# Patient Record
Sex: Female | Born: 1935 | Race: White | Hispanic: No | Smoking: Never smoker
Health system: Southern US, Community
[De-identification: ages and names within clinical notes are randomized; demographics above are authoritative.]

## PROBLEM LIST (undated history)

## (undated) DIAGNOSIS — K5790 Diverticulosis of intestine, part unspecified, without perforation or abscess without bleeding: Secondary | ICD-10-CM

## (undated) DIAGNOSIS — E785 Hyperlipidemia, unspecified: Secondary | ICD-10-CM

## (undated) DIAGNOSIS — K922 Gastrointestinal hemorrhage, unspecified: Secondary | ICD-10-CM

## (undated) DIAGNOSIS — M1990 Unspecified osteoarthritis, unspecified site: Secondary | ICD-10-CM

## (undated) DIAGNOSIS — E860 Dehydration: Secondary | ICD-10-CM

## (undated) DIAGNOSIS — R1310 Dysphagia, unspecified: Secondary | ICD-10-CM

## (undated) DIAGNOSIS — E0590 Thyrotoxicosis, unspecified without thyrotoxic crisis or storm: Secondary | ICD-10-CM

## (undated) DIAGNOSIS — I2510 Atherosclerotic heart disease of native coronary artery without angina pectoris: Secondary | ICD-10-CM

## (undated) DIAGNOSIS — I4891 Unspecified atrial fibrillation: Secondary | ICD-10-CM

## (undated) DIAGNOSIS — G8191 Hemiplegia, unspecified affecting right dominant side: Secondary | ICD-10-CM

## (undated) DIAGNOSIS — K5900 Constipation, unspecified: Secondary | ICD-10-CM

## (undated) DIAGNOSIS — I739 Peripheral vascular disease, unspecified: Secondary | ICD-10-CM

## (undated) DIAGNOSIS — F0390 Unspecified dementia without behavioral disturbance: Secondary | ICD-10-CM

---

## 2015-01-03 ENCOUNTER — Emergency Department (HOSPITAL_COMMUNITY)
Admission: EM | Admit: 2015-01-03 | Discharge: 2015-01-04 | Disposition: A | Discharge status: Home / Self Care | Payer: Medicare Other | Attending: Emergency Medicine | Admitting: Emergency Medicine

## 2015-01-03 ENCOUNTER — Emergency Department (HOSPITAL_COMMUNITY): Payer: Medicare Other

## 2015-01-03 ENCOUNTER — Encounter (HOSPITAL_COMMUNITY): Payer: Self-pay | Admitting: *Deleted

## 2015-01-03 DIAGNOSIS — F0390 Unspecified dementia without behavioral disturbance: Secondary | ICD-10-CM | POA: Diagnosis not present

## 2015-01-03 DIAGNOSIS — M1990 Unspecified osteoarthritis, unspecified site: Secondary | ICD-10-CM | POA: Diagnosis not present

## 2015-01-03 DIAGNOSIS — Z79899 Other long term (current) drug therapy: Secondary | ICD-10-CM | POA: Insufficient documentation

## 2015-01-03 DIAGNOSIS — W1839XA Other fall on same level, initial encounter: Secondary | ICD-10-CM | POA: Diagnosis not present

## 2015-01-03 DIAGNOSIS — Y998 Other external cause status: Secondary | ICD-10-CM | POA: Diagnosis not present

## 2015-01-03 DIAGNOSIS — S0083XA Contusion of other part of head, initial encounter: Secondary | ICD-10-CM | POA: Insufficient documentation

## 2015-01-03 DIAGNOSIS — Z8639 Personal history of other endocrine, nutritional and metabolic disease: Secondary | ICD-10-CM | POA: Diagnosis not present

## 2015-01-03 DIAGNOSIS — I4891 Unspecified atrial fibrillation: Secondary | ICD-10-CM | POA: Diagnosis not present

## 2015-01-03 DIAGNOSIS — K5900 Constipation, unspecified: Secondary | ICD-10-CM | POA: Diagnosis not present

## 2015-01-03 DIAGNOSIS — Z8669 Personal history of other diseases of the nervous system and sense organs: Secondary | ICD-10-CM | POA: Diagnosis not present

## 2015-01-03 DIAGNOSIS — Y92128 Other place in nursing home as the place of occurrence of the external cause: Secondary | ICD-10-CM | POA: Diagnosis not present

## 2015-01-03 DIAGNOSIS — S0181XA Laceration without foreign body of other part of head, initial encounter: Secondary | ICD-10-CM | POA: Diagnosis present

## 2015-01-03 DIAGNOSIS — T148XXA Other injury of unspecified body region, initial encounter: Secondary | ICD-10-CM

## 2015-01-03 DIAGNOSIS — Z7982 Long term (current) use of aspirin: Secondary | ICD-10-CM | POA: Diagnosis not present

## 2015-01-03 DIAGNOSIS — Y9389 Activity, other specified: Secondary | ICD-10-CM | POA: Diagnosis not present

## 2015-01-03 DIAGNOSIS — W19XXXA Unspecified fall, initial encounter: Secondary | ICD-10-CM

## 2015-01-03 HISTORY — DX: Unspecified atrial fibrillation: I48.91

## 2015-01-03 HISTORY — DX: Hemiplegia, unspecified affecting right dominant side: G81.91

## 2015-01-03 HISTORY — DX: Gastrointestinal hemorrhage, unspecified: K92.2

## 2015-01-03 HISTORY — DX: Unspecified dementia, unspecified severity, without behavioral disturbance, psychotic disturbance, mood disturbance, and anxiety: F03.90

## 2015-01-03 HISTORY — DX: Thyrotoxicosis, unspecified without thyrotoxic crisis or storm: E05.90

## 2015-01-03 HISTORY — DX: Peripheral vascular disease, unspecified: I73.9

## 2015-01-03 HISTORY — DX: Hyperlipidemia, unspecified: E78.5

## 2015-01-03 HISTORY — DX: Dysphagia, unspecified: R13.10

## 2015-01-03 HISTORY — DX: Diverticulosis of intestine, part unspecified, without perforation or abscess without bleeding: K57.90

## 2015-01-03 HISTORY — DX: Constipation, unspecified: K59.00

## 2015-01-03 HISTORY — DX: Unspecified osteoarthritis, unspecified site: M19.90

## 2015-01-03 HISTORY — DX: Dehydration: E86.0

## 2015-01-03 HISTORY — DX: Atherosclerotic heart disease of native coronary artery without angina pectoris: I25.10

## 2015-01-03 NOTE — Discharge Instructions (Signed)

## 2015-01-03 NOTE — ED Provider Notes (Signed)
CSN: [id number]     Arrival date & time 01/03/15  1948 History   First MD Initiated Contact with Patient 01/03/15 2000     Chief Complaint  Patient presents with  . Fall     (Consider location/radiation/quality/duration/timing/severity/associated sxs/prior Treatment) HPI Comments: Patient presents to the emergency department after a fall. Patient was found on the floor, circumstances unknown. Patient does have a history of dementia, cannot provide further information. EMS report hematoma and laceration of forehead, has been bandaged. Level V Caveat due to dementia.  Patient is a 81 y.o. female presenting with fall.  Fall    Past Medical History  Diagnosis Date  . Thyrotoxicosis   . Hyperlipidemia   . Dehydration   . Dementia   . Hemiplegia affecting right dominant side   . Atrial fibrillation   . CVD (cardiovascular disease)   . PVD (peripheral vascular disease)   . Diverticulosis   . Constipation   . GI hemorrhage   . Osteoarthritis   . Dysphagia    History reviewed. No pertinent past surgical history. No family history on file. History  Substance Use Topics  . Smoking status: Never Smoker   . Smokeless tobacco: Not on file  . Alcohol Use: No   OB History    No data available     Review of Systems  Unable to perform ROS: Dementia      Allergies  Review of patient's allergies indicates no known allergies.  Home Medications   Prior to Admission medications   Medication Sig Start Date End Date Taking? Authorizing Provider  acetaminophen (TYLENOL) 325 MG tablet Take 325 mg by mouth 3 (three) times daily.   Yes Historical Provider, MD  aspirin 81 MG chewable tablet Chew 81 mg by mouth daily. Take one tablet by mouth on Tuesday, Thursday and Saturday   Yes Historical Provider, MD  calcium carbonate (TUMS - DOSED IN MG ELEMENTAL CALCIUM) 500 MG chewable tablet Chew 1 tablet by mouth daily.   Yes Historical Provider, MD  doxazosin (CARDURA) 4 MG tablet Take 4 mg  by mouth daily.   Yes Historical Provider, MD  ferrous sulfate 325 (65 FE) MG tablet Take 325 mg by mouth daily with breakfast.   Yes Historical Provider, MD  furosemide (LASIX) 20 MG tablet Take 20 mg by mouth 3 (three) times a week. Take 20mg  by mouth on Mondays, Wednesdays and Fridays   Yes Historical Provider, MD  guaiFENesin-dextromethorphan (ROBITUSSIN DM) 100-10 MG/5ML syrup Take 10 mLs by mouth every 4 (four) hours as needed for cough.   Yes Historical Provider, MD  lisinopril (PRINIVIL,ZESTRIL) 5 MG tablet Take 5 mg by mouth daily.   Yes Historical Provider, MD  loratadine (CLARITIN) 10 MG tablet Take 10 mg by mouth daily. Take daily for 30 days. Last dose is tomorrow (03/05/15)   Yes Historical Provider, MD  magnesium hydroxide (MILK OF MAGNESIA) 400 MG/5ML suspension Take 30 mLs by mouth daily as needed for mild constipation (Give 30ml by mouth if there has been no bowel movement in 3 days).   Yes Historical Provider, MD  metoprolol succinate (TOPROL-XL) 25 MG 24 hr tablet Take 25 mg by mouth daily. Hold if HR < 55   Yes Historical Provider, MD  oxymetazoline (AFRIN) 0.05 % nasal spray Place 2 sprays into both nostrils 2 (two) times daily. For nose bleeds. Repeat every 10 hours as needed   Yes Historical Provider, MD  polyethylene glycol (MIRALAX / GLYCOLAX) packet Take 17 g by mouth  daily. Take 17 grams in 6 ounces of liquid by mouth twice daily   Yes Historical Provider, MD  Potassium Chloride ER 20 MEQ TBCR Take 1 tablet by mouth 3 (three) times a week. Take 20 MEQ by mouth on Mondays, Wednesdays and Fridays   Yes Historical Provider, MD  PRESCRIPTION MEDICATION Take 4 oz by mouth 3 (three) times daily. Med Pass   Yes Historical Provider, MD  promethazine (PHENERGAN) 25 MG tablet Take 25 mg by mouth 2 (two) times daily as needed for nausea or vomiting.   Yes Historical Provider, MD  ranitidine (ZANTAC) 150 MG capsule Take 150 mg by mouth every evening. Take 150 mg by mouth daily at 9pm   Yes  Historical Provider, MD  sodium chloride (OCEAN) 0.65 % SOLN nasal spray Place 2 sprays into both nostrils as needed for congestion (Two times a day as needed).   Yes Historical Provider, MD  Vitamin D, Ergocalciferol, (DRISDOL) 50000 UNITS CAPS capsule Take 50,000 Units by mouth every 30 (thirty) days. Take once a month on the 15th   Yes Historical Provider, MD   BP 138/76 mmHg  Pulse 81  Temp(Src) 98.6 F (37 C) (Oral)  Resp 24  SpO2 100% Physical Exam  Constitutional: She is oriented to person, place, and time. She appears well-developed and well-nourished. No distress.  HENT:  Head: Normocephalic. Head is with contusion and with laceration.  Right Ear: Hearing normal.  Left Ear: Hearing normal.  Nose: Nose normal.  Mouth/Throat: Oropharynx is clear and moist and mucous membranes are normal.  Eyes: Conjunctivae and EOM are normal. Pupils are equal, round, and reactive to light.  Neck: Normal range of motion. Neck supple.  Cardiovascular: Regular rhythm, S1 normal and S2 normal.  Exam reveals no gallop and no friction rub.   No murmur heard. Pulmonary/Chest: Effort normal and breath sounds normal. No respiratory distress. She exhibits no tenderness.  Abdominal: Soft. Normal appearance and bowel sounds are normal. There is no hepatosplenomegaly. There is no tenderness. There is no rebound, no guarding, no tenderness at McBurney's point and negative Murphy's sign. No hernia.  Musculoskeletal: Normal range of motion.  Neurological: She is alert and oriented to person, place, and time. She has normal strength. No cranial nerve deficit or sensory deficit. Coordination normal. GCS eye subscore is 4. GCS verbal subscore is 4. GCS motor subscore is 6.  Skin: Skin is warm and dry. Laceration noted. No rash noted. No cyanosis.  Psychiatric: She has a normal mood and affect. Her speech is normal and behavior is normal. Thought content normal.  Nursing note and vitals reviewed.   ED Course   Procedures (including critical care time) Labs Review Labs Reviewed - No data to display  Imaging Review Dg Chest 1 View  01/03/2015   CLINICAL DATA:  Fall at nursing home.  EXAM: CHEST  1 VIEW  COMPARISON:  None.  FINDINGS: Mild cardiomegaly is noted. Tracheal deviation is noted to the right most likely due to aorta. No pneumothorax or pleural effusion is noted. No acute pulmonary disease is noted. Elevated left hemidiaphragm is noted. Old right clavicular fracture is noted. Multiple triangular densities are seen over both the arms most likely related to clothing.  IMPRESSION: No acute cardiopulmonary abnormality seen.   Electronically Signed   By: Lupita RaiderJames  Green Jr, M.D.   On: 01/03/2015 20:41   Dg Pelvis 1-2 Views  01/03/2015   CLINICAL DATA:  Status post fall. Concern for pelvic injury. Initial encounter.  EXAM:  PELVIS - 1-2 VIEW  COMPARISON:  None.  FINDINGS: There is no evidence of fracture or dislocation. Bilateral hip arthroplasties appear grossly intact, though the left hip arthroplasty is incompletely imaged. There is no evidence of loosening. Degenerative change is noted at the lower lumbar spine, and mild sclerotic change is seen at the sacroiliac joints.  The visualized bowel gas pattern is grossly unremarkable in appearance. Scattered phleboliths are noted within the pelvis. Apparent heterotopic bone formation is noted overlying the right hip.  IMPRESSION: 1. No evidence of fracture or dislocation. 2. Bilateral hip arthroplasties appear grossly intact, without evidence of loosening.   Electronically Signed   By: Roanna Raider M.D.   On: 01/03/2015 20:48   Ct Head Wo Contrast  01/03/2015   CLINICAL DATA:  For head laceration and neck pain after fall at nursing home.  EXAM: CT HEAD WITHOUT CONTRAST  CT CERVICAL SPINE WITHOUT CONTRAST  TECHNIQUE: Multidetector CT imaging of the head and cervical spine was performed following the standard protocol without intravenous contrast. Multiplanar CT  image reconstructions of the cervical spine were also generated.  COMPARISON:  None.  FINDINGS: CT HEAD FINDINGS  Bony calvarium appears intact. Left frontal scalp hematoma is noted. Mild left sphenoid sinusitis is noted. Moderate diffuse cortical atrophy is noted. Mild chronic ischemic white matter disease is noted. 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 No mass effect or midline shift is noted. Ventricular size is within normal limits. There is no evidence of mass lesion, hemorrhage or acute infarction.  CT CERVICAL SPINE FINDINGS  No acute fracture is noted. There appears to be fusion of the C4, C5 and C6 vertebral bodies, resulting in reversal of normal lordosis of cervical spine. Severe degenerative disc disease is noted at C6-7 and C7-T1. Degenerative change of posterior facet joints in upper cervical spine is noted. Visualized upper lung fields appear normal.  IMPRESSION: Left frontal scalp hematoma. Mild left sphenoid sinusitis. Moderate diffuse cortical atrophy. Mild chronic ischemic white matter disease. No acute intracranial abnormality seen.  Extensive degenerative changes are noted throughout the cervical spine, including fusion of the C4, C5 and C6 vertebral bodies, which results in focal reversal of normal lordosis of cervical spine. No acute fracture is noted.   Electronically Signed   By: Lupita Raider, M.D.   On: 01/03/2015 21:17   Ct Cervical Spine Wo Contrast  01/03/2015   CLINICAL DATA:  For head laceration and neck pain after fall at nursing home.  EXAM: CT HEAD WITHOUT CONTRAST  CT CERVICAL SPINE WITHOUT CONTRAST  TECHNIQUE: Multidetector CT imaging of the head and cervical spine was performed following the standard protocol without intravenous contrast. Multiplanar CT image reconstructions of the cervical spine were also generated.  COMPARISON:  None.  FINDINGS: CT HEAD FINDINGS  Bony calvarium appears intact. Left frontal scalp hematoma is noted. Mild left sphenoid sinusitis is  noted. Moderate diffuse cortical atrophy is noted. Mild chronic ischemic white matter disease is noted. 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 No mass effect or midline shift is noted. Ventricular size is within normal limits. There is no evidence of mass lesion, hemorrhage or acute infarction.  CT CERVICAL SPINE FINDINGS  No acute fracture is noted. There appears to be fusion of the C4, C5 and C6 vertebral bodies, resulting in reversal of normal lordosis of cervical spine. Severe degenerative disc disease is noted at C6-7 and C7-T1. Degenerative change of posterior facet joints in upper cervical spine is noted. Visualized upper lung fields  appear normal.  IMPRESSION: Left frontal scalp hematoma. Mild left sphenoid sinusitis. Moderate diffuse cortical atrophy. Mild chronic ischemic white matter disease. No acute intracranial abnormality seen.  Extensive degenerative changes are noted throughout the cervical spine, including fusion of the C4, C5 and C6 vertebral bodies, which results in focal reversal of normal lordosis of cervical spine. No acute fracture is noted.   Electronically Signed   By: Lupita Raider, M.D.   On: 01/03/2015 21:17     EKG Interpretation   Date/Time:  Friday January 03 2015 20:00:45 EDT Ventricular Rate:  86 PR Interval:  212 QRS Duration: 112 QT Interval:  385 QTC Calculation: 460 R Axis:   -27 Text Interpretation:  Sinus rhythm Borderline prolonged PR interval  Incomplete left bundle branch block No previous tracing Confirmed by  Jarvis Sawa  MD, Makyna Niehoff [phone]) on 01/03/2015 8:13:56 PM      MDM   Final diagnoses:  Fall  Contusion  Abrasion    Brought to the ER for evaluation after unwitnessed fall. Patient complaining of head and neck pain. She had contusion of the left forehead with abrasion, no laceration repair necessary. CT head and cervical spine negative. Remainder of imaging was negative. Daughter reports tetanus vaccination is  up-to-date.    Gilda Crease, MD 01/03/15 [id number]

## 2015-01-03 NOTE — Progress Notes (Signed)
CSW attempted to meet with pt at bedside. However, the pt had been transported to x-ray. Daughter was present.   D[facility]. She states that the pt had an unwitnessed fall. Daughter stated " They said she fell and hit her head. It was either on the floor or the heater." Daughter informed CSW that the pt has a hx of falling, but she states that she has not fallen recently. According to daughter, the pt has not fallen within the past 6 months.  Daughter informed CSW that the pt receives assistance with completing her ADL's. However, she states the pt is also very independent.   Daughter informed CSW that the pt hip replacements in the year of 2008 and 2009. Daughter states that the pt ambulates usually while she pushes her wheelchair.  Daughter informed CSW that the pt currently lives in the skilled nursing unit of the facility. She states that she believes the pt is at the appropriate level of care at this time. Daughter states that the pt has a great support system. She states that she visits the pt often and between all of the family members the pt has a visitor everyday.   Daughter states that she has no further questions at this time.  Trish MageBrittney Zai Chmiel, LCSWA [phone] ED CSW 01/03/2015 9:17 PM

## 2015-01-03 NOTE — ED Notes (Signed)
Per EMS, pt from [facility], staff reports finding pt prone on the floor.  Pt is c/o neck pain-hx of dementia.  Small lac and hematoma noted to forehead.

## 2016-04-28 DEATH — deceased

## 2016-11-19 IMAGING — CT CT HEAD W/O CM
2 of 6 series · 11 of 47 positions shown, 13 images · non-contrast
Comparison: None.

CLINICAL DATA: For head laceration and neck pain after fall at
[HOSPITAL].

EXAM:
CT HEAD WITHOUT CONTRAST
CT CERVICAL SPINE WITHOUT CONTRAST
TECHNIQUE: Multidetector CT imaging of the head and cervical spine was
performed following the standard protocol without intravenous
contrast. Multiplanar CT image reconstructions of the cervical spine
were also generated.

[Series 8: axial recon · axial · 0.23mm/px · z∈[+973,+1134]mm · 8 of 105 slices shown, 10 images]
[im 11/105  brain]
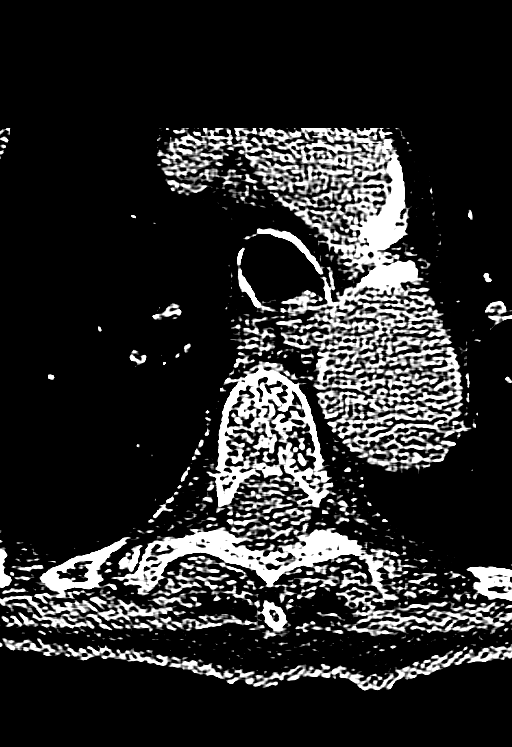
[im 11/105  bone]
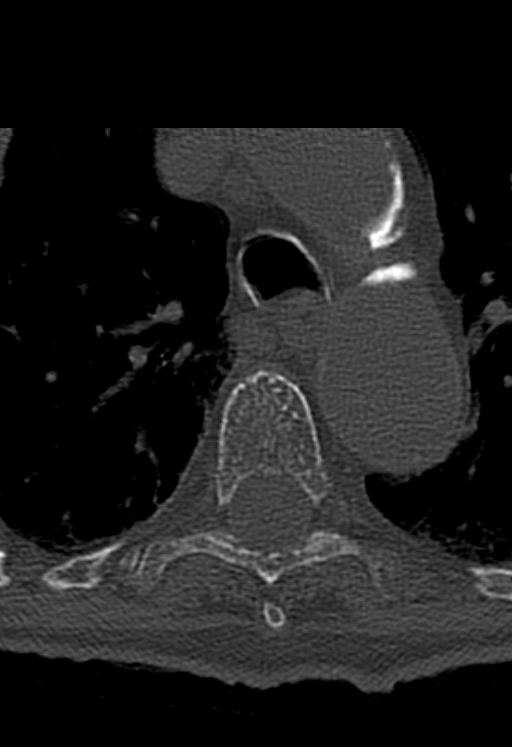
[im 21/105  brain]
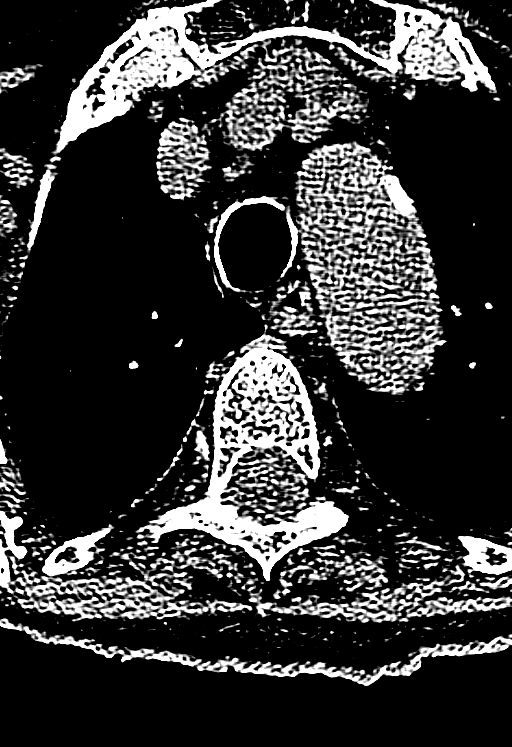
[im 32/105  brain]
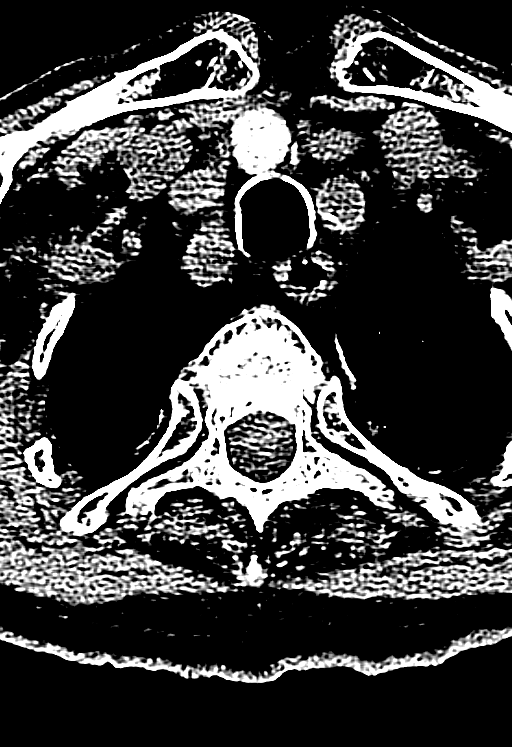
[im 42/105  brain]
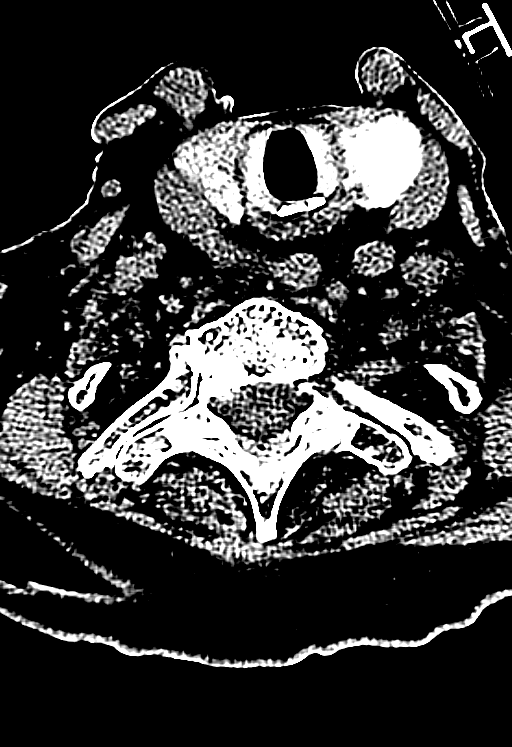
[im 63/105  brain]
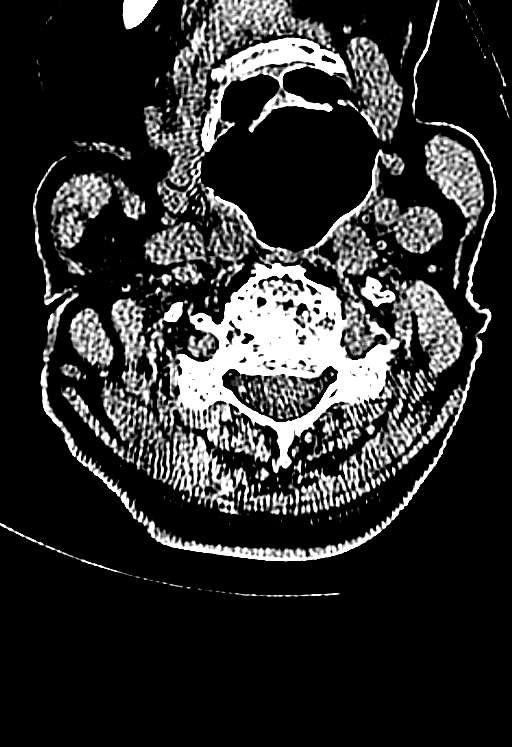
[im 63/105  bone]
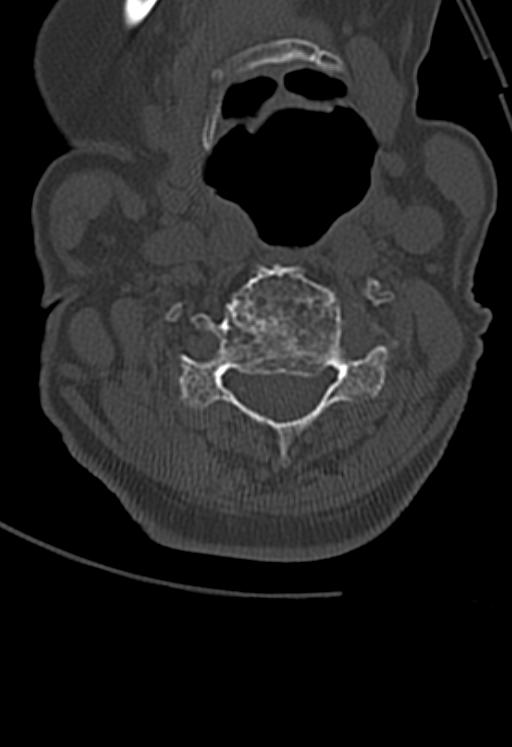
[im 73/105  brain]
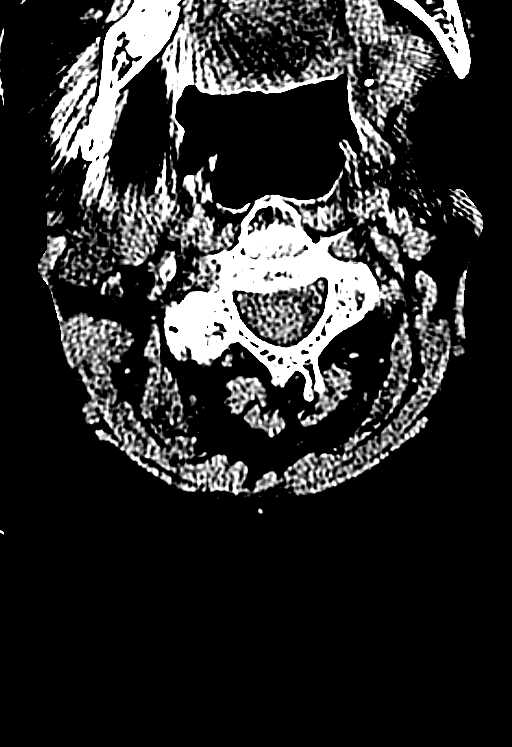
[im 84/105  brain]
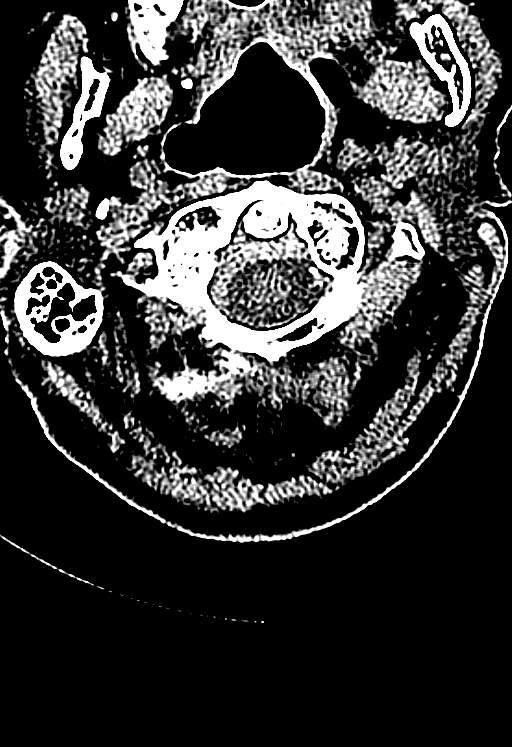
[im 94/105  brain]
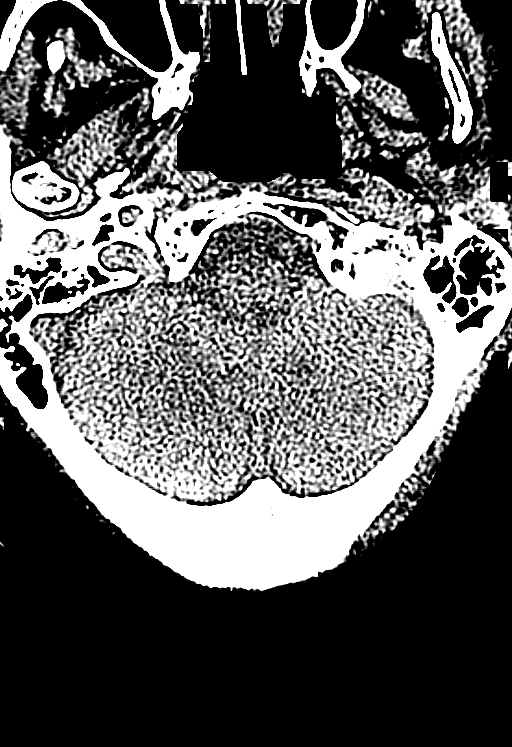

[Series 9: coronal · coronal · 0.27mm/px · 3 of 77 slices shown]
[im 26/77  brain]
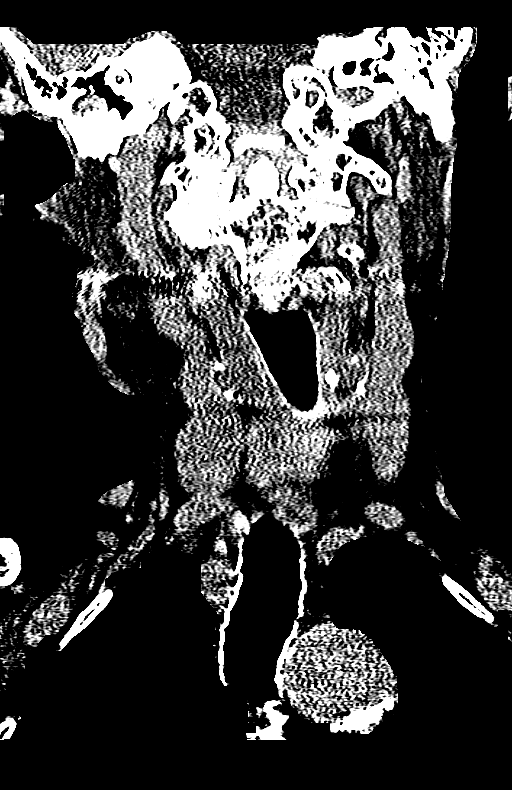
[im 34/77  brain]
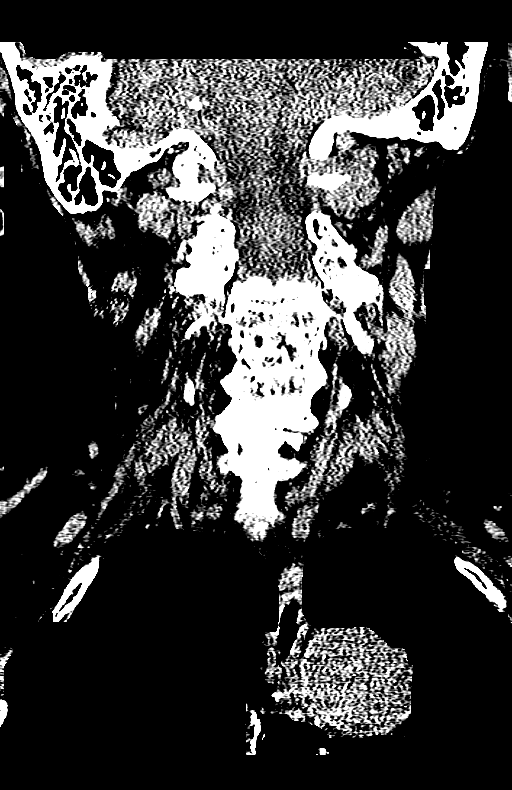
[im 43/77  brain]
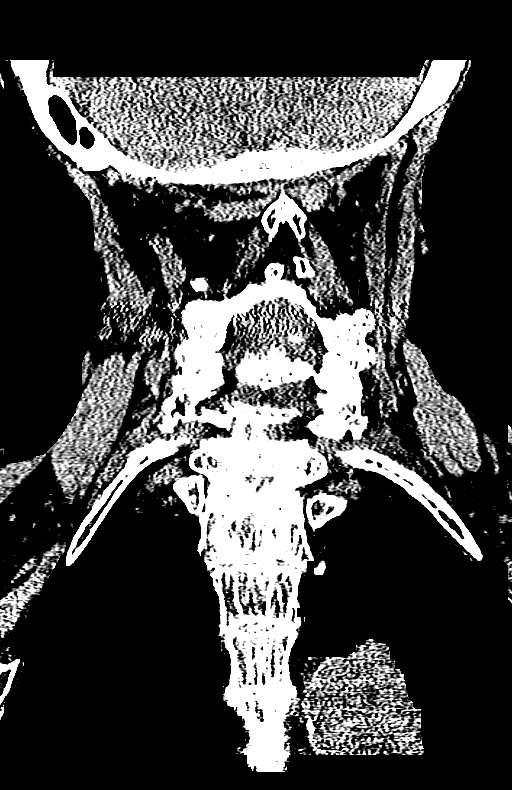

[11 of 47 positions shown; findings below may reference images not displayed]

FINDINGS: CT HEAD FINDINGS

Bony calvarium appears intact. Left frontal scalp hematoma is noted.
Mild left sphenoid sinusitis is noted. Moderate diffuse cortical
atrophy is noted. Mild chronic ischemic white matter disease is
noted. Old left basal ganglia infarction is noted. No mass effect or
midline shift is noted. Ventricular size is within normal limits.
There is no evidence of mass lesion, hemorrhage or acute infarction.

CT CERVICAL SPINE FINDINGS

No acute fracture is noted. There appears to be fusion of the C4, C5
and C6 vertebral bodies, resulting in reversal of normal lordosis of
cervical spine. Severe degenerative disc disease is noted at C6-7
and C7-T1. Degenerative change of posterior facet joints in upper
cervical spine is noted. Visualized upper lung fields appear normal.
IMPRESSION: Left frontal scalp hematoma. Mild left sphenoid sinusitis. Moderate
diffuse cortical atrophy. Mild chronic ischemic white matter
disease. No acute intracranial abnormality seen.

Extensive degenerative changes are noted throughout the cervical
spine, including fusion of the C4, C5 and C6 vertebral bodies, which
results in focal reversal of normal lordosis of cervical spine. No
acute fracture is noted.
